# Patient Record
Sex: Female | Born: 1950 | Race: White | Hispanic: No | Marital: Married | Smoking: Never smoker
Health system: Southern US, Community
[De-identification: ages and names within clinical notes are randomized; demographics above are authoritative.]

## PROBLEM LIST (undated history)

## (undated) DIAGNOSIS — J45909 Unspecified asthma, uncomplicated: Secondary | ICD-10-CM

## (undated) DIAGNOSIS — T7840XA Allergy, unspecified, initial encounter: Secondary | ICD-10-CM

## (undated) HISTORY — PX: OTHER SURGICAL HISTORY: SHX169

## (undated) HISTORY — DX: Allergy, unspecified, initial encounter: T78.40XA

## (undated) HISTORY — DX: Unspecified asthma, uncomplicated: J45.909

---

## 2004-08-08 ENCOUNTER — Emergency Department (HOSPITAL_COMMUNITY): Admission: EM | Admit: 2004-08-08 | Discharge: 2004-08-08 | Payer: Self-pay | Admitting: *Deleted

## 2011-06-13 ENCOUNTER — Ambulatory Visit (INDEPENDENT_AMBULATORY_CARE_PROVIDER_SITE_OTHER): Payer: Self-pay | Admitting: Sports Medicine

## 2011-06-13 VITALS — BP 110/70 | Ht 67.0 in | Wt 150.0 lb

## 2011-06-13 DIAGNOSIS — S92309A Fracture of unspecified metatarsal bone(s), unspecified foot, initial encounter for closed fracture: Secondary | ICD-10-CM

## 2011-06-13 DIAGNOSIS — S92301A Fracture of unspecified metatarsal bone(s), right foot, initial encounter for closed fracture: Secondary | ICD-10-CM | POA: Insufficient documentation

## 2011-06-13 DIAGNOSIS — M79671 Pain in right foot: Secondary | ICD-10-CM

## 2011-06-13 DIAGNOSIS — M79609 Pain in unspecified limb: Secondary | ICD-10-CM

## 2011-06-13 NOTE — Assessment & Plan Note (Signed)
Erythema and swelling prompted treatment for cellulitis while pt was in [place].  However, these symptoms were likely due to the fracture.  Continue NSAIDS as needed for pain.

## 2011-06-13 NOTE — Assessment & Plan Note (Addendum)
Pt placed in arch sleeve to minimize vibration, in post-op shoe with scaphoid arch support.  Advised to bear weight as tolerated, OK to swim but should limit other activities that cause pain.  Pt advised to take Calcium and Vitamin D.  She has history of navicular stress fracture, concern about her bone density.  Likely needs bone scan at some point. Advised OK to continue over the counter NSAIDS for pain.  We need to evaluate normal gait pattern and whether leg length difference and scoliotic change balance each other Follow up in 2 weeks.

## 2011-06-13 NOTE — Progress Notes (Signed)
  Subjective:    Patient ID: Cathy Patterson, female    DOB: 1951-02-26, 60 y.o.   MRN: [id number]  HPI  Cathy Patterson presents for evaluation of right foot pain. She just returned from vacation in [place], where she was doing a lot of hiking. She says that about 10 days ago, she started having pain in the right foot. She says that it was red and swollen.  She went to a clinic in the area, where they treated her for cellulitis with azithromycin. She was sent to clinic in a bigger city, when her pain and swelling did not improve with the azithromycin. There she had an x-ray, and she was told she had fracture of her second metatarsal. She was placed in a cast and told not to bear weight.    Review of Systems Denies fevers chills, body aches.    Objective:   Physical Exam  BP 110/70  Ht 5\' 7"  (1.702 m)  Wt 150 lb (68.04 kg)  BMI 23.49 kg/m2 General appearance: alert, cooperative and no distress MSK: Right foot swollen on dorsum over metatarsals Pain with palpation of second right metatarsal, pain with movement of that toe. Pt is able to bear weight with a limp  US Exam: 2nd right metatarsal shaft fracture visualized, with about 30% displacement Hypoechoic areas around 2nd, 3rd and 4th metatarsals in soft tissue 3rd and 4th metatarsals in tact, no fractures.       Assessment & Plan:

## 2011-06-28 ENCOUNTER — Ambulatory Visit: Payer: Self-pay | Admitting: Sports Medicine

## 2011-07-05 ENCOUNTER — Ambulatory Visit (INDEPENDENT_AMBULATORY_CARE_PROVIDER_SITE_OTHER): Payer: Self-pay | Admitting: Family Medicine

## 2011-07-05 ENCOUNTER — Encounter: Payer: Self-pay | Admitting: Family Medicine

## 2011-07-05 DIAGNOSIS — M79609 Pain in unspecified limb: Secondary | ICD-10-CM

## 2011-07-05 DIAGNOSIS — M79671 Pain in right foot: Secondary | ICD-10-CM

## 2011-07-05 DIAGNOSIS — S92309A Fracture of unspecified metatarsal bone(s), unspecified foot, initial encounter for closed fracture: Secondary | ICD-10-CM

## 2011-07-05 DIAGNOSIS — S92301A Fracture of unspecified metatarsal bone(s), right foot, initial encounter for closed fracture: Secondary | ICD-10-CM

## 2011-07-05 NOTE — Patient Instructions (Signed)
Your 2nd metatarsal fracture is healing up great! Use the postop shoe with scaphoid pad for 2 more weeks then transition to a comfortable shoe with arch support for 2 more weeks. Try to avoid barefoot walking as much as possible. Icing 15 minutes at a time as needed for pain. Tylenol as needed for pain. Would recommend waiting 2 weeks before doing more intense walking and lower body exercises. Generally you start walking about 10 minutes when you do and do so every other day increasing each session by 5 minutes each time. You should not be limping and pain should be less than a 3 on a scale of 1-10 when you go back to these activities. Follow up with me or Dr. Darrick Penna in 4 weeks for a repeat ultrasound.

## 2011-07-06 ENCOUNTER — Encounter: Payer: Self-pay | Admitting: Family Medicine

## 2011-07-06 NOTE — Assessment & Plan Note (Signed)
Right 2nd metatarsal shaft fracture - approximately 3-4 weeks removed from initial treatment in [place] office for metatarsal shaft fracture.  She is improved quite a bit from last visit - use postop shoe for additional 2 weeks with arch strap then transition to comfortable shoe with arch support for additional 2 weeks.  Icing, tylenol prn.  Return to walking in 2 weeks as long as not limping and pain less than a 3 on 1-10 scale.  See instructions for further.  F/u in 4 weeks for repeat eval and ultrasound.  Will move forward with DEXA scan - patient turns 60 soon and has prior h/o stress fracture and now with fairly low impact metatarsal fracture - will rule out osteoporosis/osteopenia. 

## 2011-07-06 NOTE — Assessment & Plan Note (Signed)
Right 2nd metatarsal shaft fracture - approximately 3-4 weeks removed from initial treatment in [facility] office for metatarsal shaft fracture.  She is improved quite a bit from last visit - use postop shoe for additional 2 weeks with arch strap then transition to comfortable shoe with arch support for additional 2 weeks.  Icing, tylenol prn.  Return to walking in 2 weeks as long as not limping and pain less than a 3 on 1-10 scale.  See instructions for further.  F/u in 4 weeks for repeat eval and ultrasound.  Will move forward with DEXA scan - patient turns 60 soon and has prior h/o stress fracture and now with fairly low impact metatarsal fracture - will rule out osteoporosis/osteopenia.

## 2011-07-06 NOTE — Progress Notes (Signed)
Subjective:    Patient ID: Cathy Patterson, female    DOB: [birth year], 60 y.o.   MRN: [id number]  PCP: Dr Hal Hope  HPI 60 yo F here for 3 week f/u right 2nd MT fracture.  10/15: Cathy Patterson presents for evaluation of right foot pain. She just returned from vacation in [place], where she was doing a lot of hiking. She says that about 10 days ago, she started having pain in the right foot. She says that it was red and swollen.  She went to a clinic in the area, where they treated her for cellulitis with azithromycin. She was sent to clinic in a bigger city, when her pain and swelling did not improve with the azithromycin. There she had an x-ray, and she was told she had fracture of her second metatarsal. She was placed in a cast and told not to bear weight.  11/6: Patient has done well with postop shoe with scaphoid pad - compliant with wearing this as well as the arch strap. Takes calcium and vitamin D regularly.  Has not had a DEXA scan. No longer with pan at fracture site, only mild swelling.   History reviewed. No pertinent past medical history.  Current Outpatient Prescriptions on File Prior to Visit  Medication Sig Dispense Refill  . naproxen (NAPROSYN) 500 MG tablet Take 500 mg by mouth 2 (two) times daily with a meal.          History reviewed. No pertinent past surgical history.  Allergies  Allergen Reactions  . Sulfa Antibiotics   . Terak (Terramycin)     History   Social History  . Marital Status: Married    Spouse Name: N/A    Number of Children: N/A  . Years of Education: N/A   Occupational History  . Not on file.   Social History Main Topics  . Smoking status: Never Smoker   . Smokeless tobacco: Not on file  . Alcohol Use: Not on file  . Drug Use: Not on file  . Sexually Active: Not on file   Other Topics Concern  . Not on file   Social History Narrative  . No narrative on file    Family History  Problem Relation Age of Onset  . Hypertension Mother    . Heart attack Mother   . Sudden death Mother   . Hypertension Father   . Diabetes Neg Hx   . Hyperlipidemia Neg Hx     BP 131/86  Pulse 67  Temp(Src) 98.1 F (36.7 C) (Oral)  Ht 5\' 7"  (1.702 m)  Wt 150 lb (68.04 kg)  BMI 23.49 kg/m2  Review of Systems  See HPI above.   Objective:   Physical Exam  Gen: NAD  R foot: Minimal edema overlying 2nd MT dorsally.  No other deformity, bruising. Mild TTP distal 2nd MT.  No other TTP about foot or ankle including base 5th, navicular, malleoli, other MTs. FROM ankle and toes without pain. NVI distally Ambulates without limp Bilateral leg lengths 85cm from ASIS to medial malleoli.  US Exam: Excellent interval healing of right 2nd MT shaft fracture with bony callus, neovascularity, and localizd edema overlying fracture site.  Images saved for documentation.     Assessment & Plan:  1. Right 2nd metatarsal shaft fracture - approximately 3-4 weeks removed from initial treatment in [facility] office for metatarsal shaft fracture.  She is improved quite a bit from last visit - use postop shoe for additional 2 weeks with arch strap then  transition to comfortable shoe with arch support for additional 2 weeks.  Icing, tylenol prn.  Return to walking in 2 weeks as long as not limping and pain less than a 3 on 1-10 scale.  See instructions for further.  F/u in 4 weeks for repeat eval and ultrasound.  Will move forward with DEXA scan - patient turns 60 soon and has prior h/o stress fracture and now with fairly low impact metatarsal fracture - will rule out osteoporosis/osteopenia.

## 2011-07-08 ENCOUNTER — Ambulatory Visit: Payer: Self-pay | Admitting: Sports Medicine

## 2011-08-01 ENCOUNTER — Encounter: Payer: Self-pay | Admitting: Sports Medicine

## 2011-08-01 ENCOUNTER — Ambulatory Visit (INDEPENDENT_AMBULATORY_CARE_PROVIDER_SITE_OTHER): Payer: Self-pay | Admitting: Sports Medicine

## 2011-08-01 VITALS — BP 119/76 | HR 70

## 2011-08-01 DIAGNOSIS — M79671 Pain in right foot: Secondary | ICD-10-CM

## 2011-08-01 DIAGNOSIS — M79609 Pain in unspecified limb: Secondary | ICD-10-CM

## 2011-08-01 DIAGNOSIS — S92309A Fracture of unspecified metatarsal bone(s), unspecified foot, initial encounter for closed fracture: Secondary | ICD-10-CM

## 2011-08-01 DIAGNOSIS — S92301A Fracture of unspecified metatarsal bone(s), right foot, initial encounter for closed fracture: Secondary | ICD-10-CM

## 2011-08-01 NOTE — Assessment & Plan Note (Signed)
Much improved and no pain on examination in the office today

## 2011-08-01 NOTE — Assessment & Plan Note (Signed)
[facility] ultrasound today reveals hard callus There is excellent bony healing noted on both longitudinal and transverse scans There still is increased blood flow at the proximal and distal end of the callus of the distal second metatarsal on the right  I think this represents excellent healing and she is 90-95% over the effects of this fracture  These are our strep over the next month Restart her exercises and walking program on a gradual basis Avoid standing on the toes were pointing to much pressure on the forefoot of the right foot for at least one more month  Recheck if any problems. She can check with Dr. Hal Hope who is her primary care physician to decide whether a bone density measurements are needed. It is unclear in this situation as she was walking in bare foot shoes when the injury occurred.

## 2011-08-01 NOTE — Patient Instructions (Signed)
It is ok for you to return to activity  Please avoid activities that elevate your right foot  Please continue taking calcium and Vitamin D  Continue to use arch strap for the next month  Follow up as needed, if you continue doing well, you do not need to follow up.    Thank you for seeing [place] today!

## 2011-08-01 NOTE — Progress Notes (Signed)
  Subjective:    Patient ID: Cathy Patterson, female    DOB: 07-14-1951, 60 y.o.   MRN: [id number]  HPI Patient returns for followup of a metatarsal fracture of her right foot.  She was on it while walking in bare foot shoes and stepped on a rock while in [place]. X-rays did show a displaced second metatarsal fracture. We have been treating her with conservative care and she continues making good progress. She is now out of the cast boot is having very little pain with walking. 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

## 2013-02-19 ENCOUNTER — Ambulatory Visit: Payer: Self-pay | Admitting: Family Medicine

## 2013-03-27 ENCOUNTER — Encounter (HOSPITAL_COMMUNITY): Payer: Self-pay | Admitting: Emergency Medicine

## 2013-03-27 ENCOUNTER — Emergency Department (HOSPITAL_COMMUNITY): Payer: BC Managed Care – PPO

## 2013-03-27 ENCOUNTER — Emergency Department (HOSPITAL_COMMUNITY)
Admission: EM | Admit: 2013-03-27 | Discharge: 2013-03-28 | Disposition: A | Discharge status: Home / Self Care | Payer: BC Managed Care – PPO | Attending: Emergency Medicine | Admitting: Emergency Medicine

## 2013-03-27 ENCOUNTER — Ambulatory Visit (INDEPENDENT_AMBULATORY_CARE_PROVIDER_SITE_OTHER): Payer: BC Managed Care – PPO | Admitting: Emergency Medicine

## 2013-03-27 VITALS — BP 102/68 | HR 76 | Temp 97.7°F | Resp 16 | Ht 67.0 in | Wt 157.0 lb

## 2013-03-27 DIAGNOSIS — I200 Unstable angina: Secondary | ICD-10-CM

## 2013-03-27 DIAGNOSIS — R110 Nausea: Secondary | ICD-10-CM | POA: Insufficient documentation

## 2013-03-27 DIAGNOSIS — I249 Acute ischemic heart disease, unspecified: Secondary | ICD-10-CM

## 2013-03-27 DIAGNOSIS — R079 Chest pain, unspecified: Secondary | ICD-10-CM

## 2013-03-27 DIAGNOSIS — E860 Dehydration: Secondary | ICD-10-CM

## 2013-03-27 DIAGNOSIS — R0789 Other chest pain: Secondary | ICD-10-CM | POA: Insufficient documentation

## 2013-03-27 DIAGNOSIS — Z79899 Other long term (current) drug therapy: Secondary | ICD-10-CM | POA: Insufficient documentation

## 2013-03-27 DIAGNOSIS — J45909 Unspecified asthma, uncomplicated: Secondary | ICD-10-CM | POA: Insufficient documentation

## 2013-03-27 DIAGNOSIS — R0602 Shortness of breath: Secondary | ICD-10-CM | POA: Insufficient documentation

## 2013-03-27 LAB — CBC
HCT: 39.7 % (ref 36.0–46.0)
Hemoglobin: 13.8 g/dL (ref 12.0–15.0)
MCHC: 34.8 g/dL (ref 30.0–36.0)
Platelets: 241 10*3/uL (ref 150–400)
RBC: 4.27 MIL/uL (ref 3.87–5.11)
RDW: 13.3 % (ref 11.5–15.5)
WBC: 6.1 10*3/uL (ref 4.0–10.5)

## 2013-03-27 LAB — BASIC METABOLIC PANEL
BUN: 12 mg/dL (ref 6–23)
CO2: 26 mEq/L (ref 19–32)
Calcium: 9.3 mg/dL (ref 8.4–10.5)
Chloride: 106 mEq/L (ref 96–112)
Creatinine, Ser: 0.75 mg/dL (ref 0.50–1.10)
GFR calc Af Amer: >90 mL/min (ref >90)
GFR calc non Af Amer: 90 mL/min — ABNORMAL LOW (ref >90)
Potassium: 4 mEq/L (ref 3.5–5.1)

## 2013-03-27 LAB — POCT I-STAT TROPONIN I: Troponin i, poc: 0 ng/mL (ref 0.00–0.08)

## 2013-03-27 LAB — D-DIMER, QUANTITATIVE: D-Dimer, Quant: <0.27 ug/mL-FEU (ref 0.00–0.48)

## 2013-03-27 MED ORDER — ASPIRIN EC 325 MG PO TBEC: 325.0000 mg | DELAYED_RELEASE_TABLET | Freq: Every day | ORAL | Status: DC

## 2013-03-27 MED ORDER — NITROGLYCERIN 0.4 MG SL SUBL: 0.4000 mg | SUBLINGUAL_TABLET | SUBLINGUAL | Status: DC | PRN

## 2013-03-27 NOTE — Consult Note (Signed)
Cardiology IP Consult  Reason for Consult:chest pain Referring Physician: ED  HPI: Cathy Patterson is a 62 y.o.female with hx relevant for asthma, minimal CHD risk factors who presented to the ED for evaluation of chest pressure. Patient is a very active and healthy woman who exercises almost daily without apparent CV sx. Today, she had her hair colored for the first time ever. During this process, she had a response to the agent used to color her hair and began to feel ill. This progressed and she reported chest pressure that went through to her back. A/w SOB, nausea. Sx lasted for about 2 hours. She finally went to an Urgent Care center and was provided an ASA and NTG x 1. They referred her to our ED. On arrival here, her sx had completely resolved and she felt ready and able to return home. She was at her baseline. ED evaluation here was notable for a negative troponin, normal ECG. I am asked to evaluate her further while in the ED.   History reviewed. No pertinent past medical history.  History reviewed. No pertinent past surgical history.  Family History  Problem Relation Age of Onset  . Hypertension Mother   . Heart attack Mother   . Sudden death Mother   . Hypertension Father   . Diabetes Neg Hx   . Hyperlipidemia Neg Hx     Social History:  reports that she has never smoked. She has never used smokeless tobacco. Her alcohol and drug histories are not on file.  Allergies:  Allergies  Allergen Reactions  . Sulfa Antibiotics Other (See Comments)    unknown  . Terak (Terramycin) Other (See Comments)    unknown    No current facility-administered medications for this encounter.   Current Outpatient Prescriptions  Medication Sig Dispense Refill  . albuterol (PROVENTIL HFA;VENTOLIN HFA) 108 (90 BASE) MCG/ACT inhaler Inhale 2 puffs into the lungs every 6 (six) hours as needed for wheezing.      . Cholecalciferol (VITAMIN D PO) Take 2 tablets by mouth daily.      Marland Kitchen  tetrahydrozoline-zinc (VISINE-AC) 0.05-0.25 % ophthalmic solution Place 1 drop into both eyes 3 (three) times daily as needed.        ROS: A full review of systems is obtained and is negative except as noted in the HPI.  Physical Exam: Blood pressure 111/70, pulse 68, temperature 98.8 F (37.1 C), temperature source Oral, resp. rate 22, SpO2 98.00%.  GENERAL: Thin, healthy appearing, pleasant female. Appears younger than stated age. No acute distress.  EYES: Extra ocular movements are intact. There is no lid lag. Sclera is anicteric.  ENT: Oropharynx is clear. Dentition is within normal limits.  NECK: Supple. The thyroid is not enlarged.  LYMPH: There are no masses or lymphadenopathy present.  HEART: Regular rate and rhythm with no m/g/r.  Normal S1/S2. No JVD LUNGS: Clear to auscultation There are no rales, rhonchi, or wheezes.  ABDOMEN: Soft, non-tender, and non-distended with normoactive bowel sounds. There is no hepatosplenomegaly.  EXTREMITIES: No clubbing, cyanosis, or edema.  PULSES: Carotids were +2 and equal bilaterally with no bruits. Femoral pulses were +2 and equal bilaterally. DP/PT pulses were +2 and equal bilaterally.  SKIN: Warm, dry, and intact.  NEUROLOGIC: The patient was oriented to person, place, and time. No overt neurologic deficits were detected.  PSYCH: Normal judgment and insight, mood is appropriate.    Results: Results for orders placed during the hospital encounter of 03/27/13 (from the past 24 hour(s))  CBC     Status: None   Collection Time    03/27/13  9:08 PM      Result Value Range   WBC 6.1  4.0 - 10.5 K/uL   RBC 4.27  3.87 - 5.11 MIL/uL   Hemoglobin 13.8  12.0 - 15.0 g/dL   HCT 86.5  78.4 - 69.6 %   MCV 93.0  78.0 - 100.0 fL   MCH 32.3  26.0 - 34.0 pg   MCHC 34.8  30.0 - 36.0 g/dL   RDW 29.5  28.4 - 13.2 %   Platelets 241  150 - 400 K/uL  BASIC METABOLIC PANEL     Status: Abnormal   Collection Time    03/27/13  9:08 PM      Result Value  Range   Sodium 141  135 - 145 mEq/L   Potassium 4.0  3.5 - 5.1 mEq/L   Chloride 106  96 - 112 mEq/L   CO2 26  19 - 32 mEq/L   Glucose, Bld 136 (*) 70 - 99 mg/dL   BUN 12  6 - 23 mg/dL   Creatinine, Ser 4.40  0.50 - 1.10 mg/dL   Calcium 9.3  8.4 - 10.2 mg/dL   GFR calc non Af Amer 90 (*) >90 mL/min   GFR calc Af Amer >90  >90 mL/min  POCT I-STAT TROPONIN I     Status: None   Collection Time    03/27/13  9:22 PM      Result Value Range   Troponin i, poc 0.00  0.00 - 0.08 ng/mL   Comment 3           D-DIMER, QUANTITATIVE     Status: None   Collection Time    03/27/13  9:48 PM      Result Value Range   D-Dimer, Quant <0.27  0.00 - 0.48 ug/mL-FEU    CXR: No acute cardiopulmonary disease.  EKG: NSR, rate 63. Within normal limits.   Assessment/Plan: [name] is a typically very healthy and active woman who presents for evaluation of chest pressure following an exposure to a hair coloring agent. Her CV exam now is unremarkable, she has a normal ECG and negative biomarkers. The etiology of her episode is unclear to me, but I think it is unlikely that this represents active myocardial ischemia. I have recommended that she follow up with [facility] in about a week (she currently has no PCP). Further consideration for an ischemic evaluation would be considered at that time. In the interim, if she has recurrent sx, I have instructed her to return to the ED emergently.    Sly Parlee 03/27/2013, 11:43 PM

## 2013-03-27 NOTE — ED Notes (Signed)
Per EMS, Pt was getting hair colored today around 3pm and started to get lightheaded. Pt states she thought it was from the fumes, and dismissed it. A few minutes later, pt experienced pain that started in the center of her chest and radiated to her right arm and to her back. Pt described pain as a sharp 7/10 pain. Pt went to [facility] and was given 325 ASA and 1 nitro. Pt is now pain free. BP 128/88 HR 88 98% RA

## 2013-03-27 NOTE — Progress Notes (Signed)
  Subjective:    Cathy Patterson is a 62 y.o. female who presents for evaluation of chest pain. Onset was 3 hours ago. Symptoms have been stable since that time. The patient describes the pain as dull, heaviness, pressure and radiates to the right arm and neck. Patient rates pain as a moderate pain in intensity. Associated symptoms are: chest pressure/discomfort and fatigue. Aggravating factors are: none. Alleviating factors are: rest. Patient's cardiac risk factors are: advanced age (older than 50 for men, 3 for women). Patient's risk factors for DVT/PE: none. Previous cardiac testing: electrocardiogram (ECG).  The following portions of the patient's history were reviewed and updated as appropriate: allergies, current medications, past family history, past medical history, past social history, past surgical history and problem list.  Review of Systems A comprehensive review of systems was negative.    Objective:    BP 102/68  Pulse 76  Temp(Src) 97.7 F (36.5 C) (Oral)  Resp 16  Ht 5\' 7"  (1.702 m)  Wt 157 lb (71.215 kg)  BMI 24.58 kg/m2  SpO2 98%  General Appearance:    Alert, cooperative, no distress, appears stated age  Head:    Normocephalic, without obvious abnormality, atraumatic  Eyes:    PERRL, conjunctiva/corneas clear, EOM's intact, fundi    benign, both eyes  Ears:    Normal TM's and external ear canals, both ears  Nose:   Nares normal, septum midline, mucosa normal, no drainage    or sinus tenderness  Throat:   Lips, mucosa, and tongue normal; teeth and gums normal  Neck:   Supple, symmetrical, trachea midline, no adenopathy;    thyroid:  no enlargement/tenderness/nodules; no carotid   bruit or JVD  Back:     Symmetric, no curvature, ROM normal, no CVA tenderness  Lungs:     Clear to auscultation bilaterally, respirations unlabored  Chest Wall:    No tenderness or deformity   Heart:    Regular rate and rhythm, S1 and S2 normal, no murmur, rub   or gallop     Abdomen:      Soft, non-tender, bowel sounds active all four quadrants,    no masses, no organomegaly        Extremities:   Extremities normal, atraumatic, no cyanosis or edema  Pulses:   2+ and symmetric all extremities  Skin:   Skin color, texture, turgor normal, no rashes or lesions  Lymph nodes:   Cervical, supraclavicular, and axillary nodes normal  Neurologic:   CNII-XII intact, normal strength, sensation and reflexes    throughout    Cardiographics ECG: normal sinus rhythm, no blocks or conduction defects, no ischemic changes  Imaging Chest x-ray: not indicated    Assessment:    Chest pain, suspected etiology: acute coronary syndrome    Plan:    To emergency department via ambulance. Serial cardiac markers and ECG.   Pain improved in office with NTG x 2 and ASA

## 2013-03-27 NOTE — ED Provider Notes (Signed)
CSN: [id number]     Arrival date & time 03/27/13  1952 History     First MD Initiated Contact with Patient 03/27/13 2043     Chief Complaint  Patient presents with  . Chest Pain   (Consider location/radiation/quality/duration/timing/severity/associated sxs/prior Treatment) HPI Pt seen at [facility] urgent care and sent to ED for CAD evaluation. Pt has no CAD hx. No smoking history. Mother with MI in early 41's. Pt was at salon having hair colored. Became acutely SOB with chest tightness. Pain radiated into neck and R shoulder. This was roughly 1500 today. Symptoms had improved when seen in outpt office. Given ASA and NTG with complete resolution of symptoms. No recent lengthy travel or surgery. No lower ext swelling or pain.  History reviewed. No pertinent past medical history. History reviewed. No pertinent past surgical history. Family History  Problem Relation Age of Onset  . Hypertension Mother   . Heart attack Mother   . Sudden death Mother   . Hypertension Father   . Diabetes Neg Hx   . Hyperlipidemia Neg Hx    History  Substance Use Topics  . Smoking status: Never Smoker   . Smokeless tobacco: Never Used  . Alcohol Use: Not on file   OB History   Grav Para Term Preterm Abortions TAB SAB Ect Mult Living                 Review of Systems  Constitutional: Negative for fever and chills.  HENT: Negative for neck pain.   Eyes: Negative for visual disturbance.  Respiratory: Positive for shortness of breath. Negative for cough and wheezing.   Cardiovascular: Positive for chest pain. Negative for palpitations and leg swelling.  Gastrointestinal: Negative for nausea, vomiting and abdominal pain.  Musculoskeletal: Negative for back pain.  Skin: Negative for rash and wound.  Neurological: Negative for dizziness, weakness, light-headedness, numbness and headaches.  All other systems reviewed and are negative.    Allergies  Sulfa antibiotics and Terak  Home Medications    Current Outpatient Rx  Name  Route  Sig  Dispense  Refill  . albuterol (PROVENTIL HFA;VENTOLIN HFA) 108 (90 BASE) MCG/ACT inhaler   Inhalation   Inhale 2 puffs into the lungs every 6 (six) hours as needed for wheezing.         . Cholecalciferol (VITAMIN D PO)   Oral   Take 2 tablets by mouth daily.         Marland Kitchen tetrahydrozoline-zinc (VISINE-AC) 0.05-0.25 % ophthalmic solution   Both Eyes   Place 1 drop into both eyes 3 (three) times daily as needed.          BP 111/70  Pulse 68  Temp(Src) 98.8 F (37.1 C) (Oral)  Resp 22  SpO2 98% Physical Exam  Nursing note and vitals reviewed. Constitutional: She is oriented to person, place, and time. She appears well-developed and well-nourished. No distress.  HENT:  Head: Normocephalic and atraumatic.  Mouth/Throat: Oropharynx is clear and moist.  Eyes: EOM are normal. Pupils are equal, round, and reactive to light.  Neck: Normal range of motion. Neck supple.  Cardiovascular: Normal rate and regular rhythm.  Exam reveals friction rub. Exam reveals no gallop.   No murmur heard. Pulmonary/Chest: Effort normal and breath sounds normal. No respiratory distress. She has no wheezes. She has no rales. She exhibits no tenderness.  Abdominal: Soft. Bowel sounds are normal. She exhibits no distension and no mass. There is no tenderness. There is no rebound and no  guarding.  Musculoskeletal: Normal range of motion. She exhibits no edema and no tenderness.  No calf swelling or tenderness  Neurological: She is alert and oriented to person, place, and time.  Skin: Skin is warm and dry. No rash noted. No erythema.  Psychiatric: She has a normal mood and affect. Her behavior is normal.    ED Course   Procedures (including critical care time)  Labs Reviewed  BASIC METABOLIC PANEL - Abnormal; Notable for the following:    Glucose, Bld 136 (*)    GFR calc non Af Amer 90 (*)    All other components within normal limits  CBC  D-DIMER,  QUANTITATIVE  POCT I-STAT TROPONIN I   Dg Chest Port 1 View  03/27/2013   *RADIOLOGY REPORT*  Clinical Data: Chest pain.  Dizziness.  Short of breath.  PORTABLE CHEST - 1 VIEW  Comparison: None.  Findings:  Cardiopericardial silhouette within normal limits. Mediastinal contours normal. Trachea midline.  No airspace disease or effusion. Monitoring leads are projected over the chest. No pneumothorax.  IMPRESSION: No active cardiopulmonary disease.   Original Report Authenticated By: Andreas Newport, M.D.   1. Atypical chest pain     Date: 03/27/2013  Rate: 68  Rhythm: normal sinus rhythm  QRS Axis: normal  Intervals: normal  ST/T Wave abnormalities: normal  Conduction Disutrbances:none  Narrative Interpretation:   Old EKG Reviewed: none available   MDM  Seen by cardiology. Doubt CAD. Will set up outpt follow up. Return precautions given.   Loren Racer, MD 03/27/13 [phone]

## 2013-04-25 ENCOUNTER — Encounter: Payer: Self-pay | Admitting: Family Medicine

## 2013-04-25 ENCOUNTER — Ambulatory Visit (INDEPENDENT_AMBULATORY_CARE_PROVIDER_SITE_OTHER): Payer: BC Managed Care – PPO | Admitting: Family Medicine

## 2013-04-25 VITALS — BP 110/72 | HR 82 | Temp 98.5°F | Resp 16 | Ht 67.0 in | Wt 158.2 lb

## 2013-04-25 DIAGNOSIS — J4520 Mild intermittent asthma, uncomplicated: Secondary | ICD-10-CM | POA: Insufficient documentation

## 2013-04-25 DIAGNOSIS — Z23 Encounter for immunization: Secondary | ICD-10-CM

## 2013-04-25 DIAGNOSIS — J45909 Unspecified asthma, uncomplicated: Secondary | ICD-10-CM

## 2013-04-25 DIAGNOSIS — Z760 Encounter for issue of repeat prescription: Secondary | ICD-10-CM

## 2013-04-25 MED ORDER — ALPRAZOLAM 0.25 MG PO TABS: ORAL_TABLET | ORAL | Status: DC

## 2013-04-25 MED ORDER — ALBUTEROL SULFATE HFA 108 (90 BASE) MCG/ACT IN AERS: 2.0000 | INHALATION_SPRAY | Freq: Four times a day (QID) | RESPIRATORY_TRACT | Status: AC | PRN

## 2013-04-25 MED ORDER — VALACYCLOVIR HCL 1 G PO TABS: ORAL_TABLET | ORAL | Status: DC

## 2013-04-25 NOTE — Progress Notes (Signed)
S:  This 62 y.o. 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 She has been in good health and has not had recurrent chest pain (eval in July at ED- noncardiac atypical CP). She has mild EIA and uses Albuterol MDI prn, not even daily. She reports some allergies to dust and dog hair as well as mild weather changes. She participates in long-distance hiking in [place] and is planning a trip in the spring. 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 Last CPE was ~ 1 year ago.  PMHx, Soc Hx and Fam Hx reviewed. Medications reconciled.  ROS: As per HPI; otherwise noncontributory.  O: Filed Vitals:   04/25/13 1552  BP: 110/72  Pulse: 82  Temp: 98.5 F (36.9 C)  Resp: 16   GEN: In NAD: WN,WD. HENT: [place]/AT; EOMI w/ clear conj/sclerae. Otherwise unremarkable. COR: RRR. LUNGS; Normal resp rate and effort. SKIN: W&D; intact w/o rashes or pallor. NEURO: A&O x 3; CNs intact. Nonfocal.  A/P: Issue of repeat prescriptions  Need for prophylactic vaccination with combined diphtheria-tetanus-pertussis (DTP) vaccine - Plan: Tdap vaccine greater than or equal to 7yo IM  Mild intermittent reactive airway disease without complication    Meds ordered this encounter  Medications  . albuterol (PROVENTIL HFA;VENTOLIN HFA) 108 (90 BASE) MCG/ACT inhaler    Sig: Inhale 2 puffs into the lungs every 6 (six) hours as needed for wheezing.    Dispense:  1 Inhaler    Refill:  5  . ALPRAZolam (XANAX) 0.25 MG tablet    Sig: Take 1/2 -1 tablet by mouth prn anxiety.    Dispense:  30 tablet    Refill:  0  . valACYclovir (VALTREX) 1000 MG tablet    Sig: Take 1 tablet twice a day or as directed.    Dispense:  30 tablet    Refill:  3   RTC in Spring 2015 for CPE.

## 2013-07-04 ENCOUNTER — Other Ambulatory Visit: Payer: Self-pay

## 2013-09-20 ENCOUNTER — Telehealth: Payer: Self-pay | Admitting: Radiology

## 2013-09-20 MED ORDER — ALPRAZOLAM 0.25 MG PO TABS: ORAL_TABLET | ORAL | Status: DC

## 2013-09-20 MED ORDER — VALACYCLOVIR HCL 1 G PO TABS: ORAL_TABLET | ORAL | Status: DC

## 2013-09-20 NOTE — Telephone Encounter (Signed)
Alprazolam refill phoned to pharmacy and Valtrex routed electronically.

## 2013-09-20 NOTE — Telephone Encounter (Signed)
Would like to know if she can have RF on Xanax and V[facility]rex? [facility] CB # [phone]

## 2013-09-23 NOTE — Telephone Encounter (Signed)
lmom that both rx's phoned into pharmacy.

## 2013-09-24 ENCOUNTER — Ambulatory Visit (INDEPENDENT_AMBULATORY_CARE_PROVIDER_SITE_OTHER): Payer: BC Managed Care – PPO | Admitting: Family Medicine

## 2013-09-24 ENCOUNTER — Encounter: Payer: Self-pay | Admitting: Family Medicine

## 2013-09-24 VITALS — BP 138/89 | HR 73 | Ht 68.0 in | Wt 160.0 lb

## 2013-09-24 DIAGNOSIS — M549 Dorsalgia, unspecified: Secondary | ICD-10-CM

## 2013-09-29 ENCOUNTER — Encounter: Payer: Self-pay | Admitting: Family Medicine

## 2013-09-29 DIAGNOSIS — M549 Dorsalgia, unspecified: Secondary | ICD-10-CM | POA: Insufficient documentation

## 2013-09-29 NOTE — Assessment & Plan Note (Signed)
her description and history do not classically fit with musculoskeletal pain.  Possible she's having biliary colic, less likely pancreaitis.  Has improved though over past week.  Advised if pain worsens to come back for evaluation or see her PCP for exam, possible further studies.  Otherwise f/u with us prn.

## 2013-09-29 NOTE — Progress Notes (Signed)
Patient ID: Danae ChenDeborah Kittel, female   DOB: 1950-09-07, 63 y.o.   MRN: [id number]  PCP: Oval Linseyitter, Karen Denise  Subjective:   HPI: Patient is a 63 y.o. female here for back pain.  Patient denies known trauma. States about a week ago she developoed fairly intense right sided thoracic back pain radiating around rib cage on right side. No nausea, vomiting, sweating, association with eating. No melena, hematochezia. Has improved quite a bit but not completely. No rash. Has had back pain in the past but usually lower middle of back. No numbness/tingling.  History reviewed. No pertinent past medical history.  Current Outpatient Prescriptions on File Prior to Visit  Medication Sig Dispense Refill  . albuterol (PROVENTIL HFA;VENTOLIN HFA) 108 (90 BASE) MCG/ACT inhaler Inhale 2 puffs into the lungs every 6 (six) hours as needed for wheezing.  1 Inhaler  5  . ALPRAZolam (XANAX) 0.25 MG tablet Take 1/2 -1 tablet by mouth prn anxiety.  30 tablet  0  . Cholecalciferol (VITAMIN D PO) Take 2 tablets by mouth daily.      Marland Kitchen. tetrahydrozoline-zinc (VISINE-AC) 0.05-0.25 % ophthalmic solution Place 1 drop into both eyes 3 (three) times daily as needed.      . valACYclovir (VALTREX) 1000 MG tablet Take 1 tablet twice a day or as directed.  30 tablet  3   No current facility-administered medications on file prior to visit.    History reviewed. No pertinent past surgical history.  Allergies  Allergen Reactions  . Sulfa Antibiotics Other (See Comments)    unknown  . Terak [Terramycin] Other (See Comments)    unknown    History   Social History  . Marital Status: Married    Spouse Name: N/A    Number of Children: N/A  . Years of Education: N/A   Occupational History  . Not on file.   Social History Main Topics  . Smoking status: Never Smoker   . Smokeless tobacco: Never Used  . Alcohol Use: Not on file  . Drug Use: Not on file  . Sexual Activity: Not on file   Other Topics Concern  . Not  on file   Social History Narrative  . No narrative on file    Family History  Problem Relation Age of Onset  . Hypertension Mother   . Heart attack Mother   . Sudden death Mother   . Hypertension Father   . Diabetes Neg Hx   . Hyperlipidemia Neg Hx     BP 138/89  Pulse 73  Ht 5\' 8"  (1.727 m)  Wt 160 lb (72.576 kg)  BMI 24.33 kg/m2  Review of Systems: See HPI above.    Objective:  Physical Exam:  Gen: NAD  Abd: soft, nt, nd.  No HSM.  Back: No gross deformity, scoliosis. No TTP .  No midline or bony TTP. FROM. Strength LEs 5/5 all muscle groups.  2+ MSRs in patellar and achilles tendons, equal bilaterally. Negative SLRs. Sensation intact to light touch bilaterally. Negative logroll bilateral hips.    Assessment & Plan:  1. Back/abdominal pain - her description and history do not classically fit with musculoskeletal pain.  Possible she's having biliary colic, less likely pancreaitis.  Has improved though over past week.  Advised if pain worsens to come back for evaluation or see her PCP for exam, possible further studies.  Otherwise f/u with us prn.

## 2014-01-23 ENCOUNTER — Ambulatory Visit: Payer: Self-pay | Admitting: Family Medicine

## 2014-01-23 VITALS — BP 124/74 | HR 76 | Temp 98.7°F | Resp 16 | Ht 67.0 in | Wt 154.4 lb

## 2014-01-23 DIAGNOSIS — R300 Dysuria: Secondary | ICD-10-CM

## 2014-01-23 DIAGNOSIS — N390 Urinary tract infection, site not specified: Secondary | ICD-10-CM

## 2014-01-23 LAB — POCT UA - MICROSCOPIC ONLY
Casts, Ur, LPF, POC: NEGATIVE
Crystals, Ur, HPF, POC: NEGATIVE
Mucus, UA: NEGATIVE
Renal tubular cells: POSITIVE
Yeast, UA: NEGATIVE

## 2014-01-23 LAB — POCT URINALYSIS DIPSTICK
Bilirubin, UA: NEGATIVE
Glucose, UA: NEGATIVE
Ketones, UA: NEGATIVE
Nitrite, UA: NEGATIVE
Protein, UA: >=300
Spec Grav, UA: 1.02
Urobilinogen, UA: 0.2
pH, UA: 5.5

## 2014-01-23 MED ORDER — CIPROFLOXACIN HCL 500 MG PO TABS: 500.0000 mg | ORAL_TABLET | Freq: Two times a day (BID) | ORAL | Status: DC

## 2014-01-23 NOTE — Patient Instructions (Signed)
Use the cipro twice a day for a UTI.  I will be in touch with your urine culture results when they come in.  You can probably stop the cipro after 7 days. Let me know if you do not feel better or if you start to feel worse!

## 2014-01-23 NOTE — Progress Notes (Signed)
[facility] [address] [place] [address] [phone]  Date:  01/23/2014   Name:  Cathy Patterson   DOB:  1951/07/27   MRN:  [id number]  PCP:  Oval Linsey    Chief Complaint: Dysuria   History of Present Illness:  Cathy Patterson is a 63 y.o. very pleasant female patient who presents with the following:  She is here today with a likley UTI.  She has noted sx for about 3 days- got worse today.   She has noted urinary frequency, dysuria, burning.  She has not necessarily noted blood in her urine, but did see a dark spot on her panty liner.   No fever, no nausea or vomiting. She notes pain in her left flank just when she urinates.  She is eating ok.    She is generally healthy  She has had a UTI in the past and this seems like the same to her No chronic meds  Patient Active Problem List   Diagnosis Date Noted  . Back pain 09/29/2013  . Mild intermittent reactive airway disease without complication 04/25/2013  . Right foot pain 06/13/2011  . Fracture of metatarsal bone of right foot 06/13/2011    No past medical history on file.  No past surgical history on file.  History  Substance Use Topics  . Smoking status: Never Smoker   . Smokeless tobacco: Never Used  . Alcohol Use: Not on file    Family History  Problem Relation Age of Onset  . Hypertension Mother   . Heart attack Mother   . Sudden death Mother   . Hypertension Father   . Diabetes Neg Hx   . Hyperlipidemia Neg Hx     Allergies  Allergen Reactions  . Sulfa Antibiotics Other (See Comments)    unknown  . Terak [Terramycin] Other (See Comments)    unknown    Medication list has been reviewed and updated.  Current Outpatient Prescriptions on File Prior to Visit  Medication Sig Dispense Refill  . albuterol (PROVENTIL HFA;VENTOLIN HFA) 108 (90 BASE) MCG/ACT inhaler Inhale 2 puffs into the lungs every 6 (six) hours as needed for wheezing.  1 Inhaler  5  . ALPRAZolam  (XANAX) 0.25 MG tablet Take 1/2 -1 tablet by mouth prn anxiety.  30 tablet  0  . Cholecalciferol (VITAMIN D PO) Take 2 tablets by mouth daily.      Marland Kitchen tetrahydrozoline-zinc (VISINE-AC) 0.05-0.25 % ophthalmic solution Place 1 drop into both eyes 3 (three) times daily as needed.      . valACYclovir (VALTREX) 1000 MG tablet Take 1 tablet twice a day or as directed.  30 tablet  3   No current facility-administered medications on file prior to visit.    Review of Systems:  As per HPI- otherwise negative.   Physical Examination: Filed Vitals:   01/23/14 1115  BP: 124/74  Pulse: 76  Temp: 98.7 F (37.1 C)  Resp: 16   Filed Vitals:   01/23/14 1115  Height: 5\' 7"  (1.702 m)  Weight: 154 lb 6.4 oz (70.035 kg)   Body mass index is 24.18 kg/(m^2). Ideal Body Weight: Weight in (lb) to have BMI = 25: 159.3  GEN: WDWN, NAD, Non-toxic, A & O x 3, slim build, looks well HEENT: Atraumatic, Normocephalic. Neck supple. No masses, No LAD. Ears and Nose: No external deformity. CV: RRR, No M/G/R. No JVD. No thrill. No extra heart sounds. PULM: CTA B, no wheezes, crackles, rhonchi.  No retractions. No resp. distress. No accessory muscle use. ABD: S, NT, ND, +BS. No rebound. No HSM.  No CVA tenderness. Exam is benign  EXTR: No c/c/e NEURO Normal gait.  PSYCH: Normally interactive. Conversant. Not depressed or anxious appearing.  Calm demeanor.   Results for orders placed in visit on 01/23/14  POCT URINALYSIS DIPSTICK      Result Value Ref Range   Color, UA yellow     Clarity, UA cloudy     Glucose, UA neg     Bilirubin, UA neg     Ketones, UA neg     Spec Grav, UA 1.020     Blood, UA large     pH, UA 5.5     Protein, UA >=300     Urobilinogen, UA 0.2     Nitrite, UA neg     Leukocytes, UA large (3+)    POCT UA - MICROSCOPIC ONLY      Result Value Ref Range   WBC, Ur, HPF, POC TNTC     RBC, urine, microscopic TNTC     Bacteria, U Microscopic 1+     Mucus, UA neg     Epithelial cells,  urine per micros 0-1     Crystals, Ur, HPF, POC neg     Casts, Ur, LPF, POC neg     Yeast, UA neg     Renal tubular cells positive      Assessment and Plan: Dysuria - Plan: POCT urinalysis dipstick, POCT UA - Microscopic Only, ciprofloxacin (CIPRO) 500 MG tablet, Urine culture  UTI (urinary tract infection)  Treat for UTI with cipro- chose this agent as she has noted some flank pain and may have early pyelo.    Signed Abbe AmsterdamJessica Jourden Gilson, MD

## 2014-01-25 LAB — URINE CULTURE: Colony Count: >=100000

## 2014-05-02 ENCOUNTER — Telehealth: Payer: Self-pay

## 2014-05-02 MED ORDER — ALPRAZOLAM 0.25 MG PO TABS: ORAL_TABLET | ORAL | Status: DC

## 2014-05-02 NOTE — Telephone Encounter (Signed)
Alprazolam refill phoned to pt's pharmacy. 

## 2014-05-02 NOTE — Telephone Encounter (Signed)
Pt needs her Cathy Patterson refilled before going out of town for a month.   Pt scheduled CPE for 07/09/2014 with Dr. Audria Nine.  PT# [phone]

## 2014-05-07 ENCOUNTER — Ambulatory Visit (INDEPENDENT_AMBULATORY_CARE_PROVIDER_SITE_OTHER): Payer: BC Managed Care – PPO | Admitting: Family Medicine

## 2014-05-07 ENCOUNTER — Encounter: Payer: Self-pay | Admitting: Family Medicine

## 2014-05-07 VITALS — BP 114/68 | HR 69 | Temp 98.2°F | Resp 17 | Ht 67.0 in | Wt 162.0 lb

## 2014-05-07 DIAGNOSIS — Z1231 Encounter for screening mammogram for malignant neoplasm of breast: Secondary | ICD-10-CM

## 2014-05-07 DIAGNOSIS — F411 Generalized anxiety disorder: Secondary | ICD-10-CM

## 2014-05-07 MED ORDER — ALPRAZOLAM 0.25 MG PO TABS: ORAL_TABLET | ORAL | Status: DC

## 2014-05-07 NOTE — Progress Notes (Signed)
S:  This 63 y.o. Cauc female has chronic anxiety disorder and is here for medication refill. She is scheduled for CPE/PAP w/ labs in Nov 2015. She is leaving next week to drive to [place] to stay w/ her daughter who is expecting her 2nd child in October. Pt feels well but has gained weight due to lack of regular exercise; she enjoys long distance hiking but has not been able to do that lately. Pt has no had MMG in > 5 years. She recalls having breast ultrasound due to dense breast tissue.  Patient Active Problem List   Diagnosis Date Noted  . Back pain 09/29/2013  . Mild intermittent reactive airway disease without complication 04/25/2013  . Right foot pain 06/13/2011  . Fracture of metatarsal bone of right foot 06/13/2011    Prior to Admission medications   Medication Sig Start Date End Date Taking? Authorizing Provider  albuterol (PROVENTIL HFA;VENTOLIN HFA) 108 (90 BASE) MCG/ACT inhaler Inhale 2 puffs into the lungs every 6 (six) hours as needed for wheezing. 04/25/13  Yes Maurice March, MD  ALPRAZolam Prudy Feeler) 0.25 MG tablet Take 1/2 -1 tablet by mouth prn anxiety.   Yes Maurice March, MD  Cholecalciferol (VITAMIN D PO) Take 2 tablets by mouth daily.   Yes Historical Provider, MD  tetrahydrozoline-zinc (VISINE-AC) 0.05-0.25 % ophthalmic solution Place 1 drop into both eyes 3 (three) times daily as needed.   Yes Historical Provider, MD  valACYclovir (VALTREX) 1000 MG tablet Take 1 tablet twice a day or as directed. 09/20/13  Yes Maurice March, MD   History   Social History  . Marital Status: Married    Spouse Name: N/A    Number of Children: N/A  . Years of Education: N/A   Occupational History  . Not on file.   Social History Main Topics  . Smoking status: Never Smoker   . Smokeless tobacco: Never Used  . Alcohol Use: Not on file  . Drug Use: Not on file  . Sexual Activity: Not on file    ROS: Negative for abnormal weight change, fatigue, CP or tightness,  palpitations, SOB or DOE, GI problems, myalgias, rashes, HA, dizziness, syncope, agitation, confusion, concentration difficulties or sleep disturbance.  O: Filed Vitals:   05/07/14 0843  BP: 114/68  Pulse: 69  Temp: 98.2 F (36.8 C)  Resp: 17    GEN: In NAD; WN, WD. HENT: [place]/AT; EOMI w/ clear conj/sclerae. Otherwise unremarkable. COR: RRR. LUNGS: Unlabored resp. SKIN: W&D; intact w/o erythema, diaphoresis or pallor. MS: MAEs; no deformities, atrophy or edema. NEURO: A&O x 3; CNs intact. Nonfocal.  A/P: Anxiety state, unspecified- RF Alprazolam; encouraged healthy lifestyle.  Other screening mammogram - Plan: MM Digital Screening (pt advised about 3D tomography which is beneficial for women who have dense breast).  Meds ordered this encounter  Medications  . ALPRAZolam (XANAX) 0.25 MG tablet    Sig: Take 1/2 -1 tablet by mouth prn anxiety.    Dispense:  30 tablet    Refill:  1

## 2014-05-07 NOTE — Patient Instructions (Signed)

## 2014-06-13 ENCOUNTER — Other Ambulatory Visit: Payer: Self-pay

## 2014-07-09 ENCOUNTER — Encounter: Payer: Self-pay | Admitting: Family Medicine

## 2014-07-09 ENCOUNTER — Ambulatory Visit (INDEPENDENT_AMBULATORY_CARE_PROVIDER_SITE_OTHER): Payer: BC Managed Care – PPO | Admitting: Family Medicine

## 2014-07-09 VITALS — BP 106/73 | HR 65 | Temp 97.6°F | Resp 16 | Ht 67.0 in | Wt 162.0 lb

## 2014-07-09 DIAGNOSIS — Z0000 Encounter for general adult medical examination without abnormal findings: Secondary | ICD-10-CM

## 2014-07-09 DIAGNOSIS — Z1159 Encounter for screening for other viral diseases: Secondary | ICD-10-CM

## 2014-07-09 DIAGNOSIS — Z124 Encounter for screening for malignant neoplasm of cervix: Secondary | ICD-10-CM

## 2014-07-09 DIAGNOSIS — Z1211 Encounter for screening for malignant neoplasm of colon: Secondary | ICD-10-CM

## 2014-07-09 DIAGNOSIS — Z01419 Encounter for gynecological examination (general) (routine) without abnormal findings: Secondary | ICD-10-CM

## 2014-07-09 LAB — POCT UA - MICROSCOPIC ONLY
CASTS, UR, LPF, POC: NEGATIVE
Epithelial cells, urine per micros: NEGATIVE
Mucus, UA: NEGATIVE
RBC, URINE, MICROSCOPIC: NEGATIVE
WBC, Ur, HPF, POC: NEGATIVE
YEAST UA: NEGATIVE

## 2014-07-09 LAB — COMPLETE METABOLIC PANEL WITH GFR
ALT: 24 U/L (ref 0–35)
AST: 21 U/L (ref 0–37)
Albumin: 3.9 g/dL (ref 3.5–5.2)
Alkaline Phosphatase: 67 U/L (ref 39–117)
BUN: 14 mg/dL (ref 6–23)
CO2: 24 mEq/L (ref 19–32)
CREATININE: 0.74 mg/dL (ref 0.50–1.10)
Calcium: 9.2 mg/dL (ref 8.4–10.5)
Chloride: 104 mEq/L (ref 96–112)
GFR, Est African American: >89 mL/min
GFR, Est Non African American: 87 mL/min
Glucose, Bld: 79 mg/dL (ref 70–99)
Potassium: 4.5 mEq/L (ref 3.5–5.3)
Sodium: 138 mEq/L (ref 135–145)
Total Bilirubin: 0.6 mg/dL (ref 0.2–1.2)
Total Protein: 6 g/dL (ref 6.0–8.3)

## 2014-07-09 LAB — POCT URINALYSIS DIPSTICK
Bilirubin, UA: NEGATIVE
Blood, UA: NEGATIVE
Glucose, UA: NEGATIVE
KETONES UA: NEGATIVE
LEUKOCYTES UA: NEGATIVE
NITRITE UA: NEGATIVE
PROTEIN UA: NEGATIVE
Spec Grav, UA: <=1.005
UROBILINOGEN UA: 0.2
pH, UA: 5

## 2014-07-09 LAB — TSH: TSH: 1.781 u[IU]/mL (ref 0.350–4.500)

## 2014-07-09 LAB — LIPID PANEL
Cholesterol: 223 mg/dL — ABNORMAL HIGH (ref 0–200)
HDL: 109 mg/dL (ref >39)
LDL Cholesterol: 99 mg/dL (ref 0–99)
TRIGLYCERIDES: 76 mg/dL (ref <150)
Total CHOL/HDL Ratio: 2 Ratio
VLDL: 15 mg/dL (ref 0–40)

## 2014-07-09 NOTE — Progress Notes (Signed)
Subjective:    Patient ID: Cathy ChenDeborah Whidbee, female    DOB: 02/11/1951, 63 y.o.   MRN: [id number]  HPI  This 63 y.o. Cauc female is here for CPE/PAP. 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 Pt plans to schedule appt for skin surveillance. Pt has intermittent airway disease w/ bronchospasms associated w/ specific triggers; she has albuterol MDI for prn use. 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   HCM: MMG- Pt to sch @ [facility].           PAP- > 5 years ago (last one negative).           CRS- Never; "insurance deductible to high".            IMM- Pt declines Flu and other vaccines.  Patient Active Problem List   Diagnosis Date Noted  . Back pain 09/29/2013  . Mild intermittent reactive airway disease without complication 04/25/2013  . Right foot pain 06/13/2011  . Fracture of metatarsal bone of right foot 06/13/2011    Prior to Admission medications   Medication Sig Start Date End Date Taking? Authorizing Provider  albuterol (PROVENTIL HFA;VENTOLIN HFA) 108 (90 BASE) MCG/ACT inhaler Inhale 2 puffs into the lungs every 6 (six) hours as needed for wheezing. 04/25/13  Yes Maurice MarchBarbara B Camisha Srey, MD  ALPRAZolam Prudy Feeler(XANAX) 0.25 MG tablet Take 1/2 -1 tablet by mouth prn anxiety. 05/07/14  Yes Maurice MarchBarbara B Cynde Menard, MD  Cholecalciferol (VITAMIN D PO) Take 2 tablets by mouth daily.   Yes Historical Provider, MD  tetrahydrozoline-zinc (VISINE-AC) 0.05-0.25 % ophthalmic solution Place 1 drop into both eyes 3 (three) times daily as needed.   Yes Historical Provider, MD  valACYclovir (VALTREX) 1000 MG tablet Take 1 tablet twice a day or as directed. 09/20/13  Yes Maurice MarchBarbara B Elveria Lauderbaugh, MD    History   Social History  . Marital Status: Married    Spouse Name: N/A    Number of Children: N/A  . Years of Education: N/A   Occupational History  . Unemployed     Social History Main Topics  . Smoking status: Never Smoker   . Smokeless tobacco: Never Used  . Alcohol Use: 0.6 oz/week    1 Not specified per week  . Drug Use: No  . Sexual Activity: Yes   Other Topics Concern  . Not on file   Social History Narrative   Married. Education: [employer]. Exercise: Yes    Family History  Problem Relation Age of Onset  . Hypertension Mother   . Heart attack Mother   . Sudden death Mother   . Heart disease Mother   . Hyperlipidemia Mother   . Hypertension Father   . Cancer Father   . Hyperlipidemia Father   . Diabetes Neg Hx   . Hypertension Sister   . Cancer Brother     Review of Systems  Constitutional: Negative.   HENT: Negative.   Eyes: Negative.   Respiratory: Negative.   Cardiovascular: Negative.   Gastrointestinal: Negative.   Endocrine: Negative.   Genitourinary: Negative.   Musculoskeletal: Negative.   Skin: Negative.   Allergic/Immunologic: Positive for environmental allergies.  Neurological: Negative.   Hematological: Negative.   Psychiatric/Behavioral: Negative.        Objective:   Physical Exam  Constitutional: She is oriented to person, place, and time. Vital signs are normal.  She appears well-developed and well-nourished. No distress.  HENT:  Head: Normocephalic and atraumatic.  Right Ear: Hearing, external ear and ear canal normal.  Left Ear: Hearing, tympanic membrane, external ear and ear canal normal.  Nose: Nose normal. No septal deviation or nasal septal hematoma.  Mouth/Throat: Uvula is midline, oropharynx is clear and moist and mucous membranes are normal. No oral lesions. Normal dentition. No dental caries.  R TM obscured by excess cerumen; pt declines irrigation, stating her hearing is normal.  Eyes: Conjunctivae, EOM and lids are normal. Pupils are equal, round, and reactive to light. No scleral icterus.  Fundoscopic exam:      The right eye shows no arteriolar narrowing, no AV nicking and no  papilledema. The right eye shows red reflex.       The left eye shows no arteriolar narrowing, no AV nicking and no papilledema. The left eye shows red reflex.  Neck: Trachea normal, normal range of motion, full passive range of motion without pain and phonation normal. Neck supple. No JVD present. No spinous process tenderness and no muscular tenderness present. Carotid bruit is not present. No thyroid mass and no thyromegaly present.  Cardiovascular: Normal rate, regular rhythm, S1 normal, S2 normal, intact distal pulses and normal pulses.   No extrasystoles are present. PMI is not displaced.  Exam reveals no gallop and no friction rub.   No murmur heard. Pulmonary/Chest: Effort normal and breath sounds normal. No respiratory distress. She has no decreased breath sounds. She has no wheezes. She has no rhonchi. Right breast exhibits no inverted nipple, no mass, no nipple discharge, no skin change and no tenderness. Left breast exhibits no inverted nipple, no mass, no nipple discharge, no skin change and no tenderness. Breasts are symmetrical.  Abdominal: Soft. Normal appearance and bowel sounds are normal. She exhibits no distension, no abdominal bruit, no pulsatile midline mass and no mass. There is no hepatosplenomegaly. There is no tenderness. There is no guarding and no CVA tenderness.  Genitourinary: Vagina normal and uterus normal. There is no rash, tenderness or lesion on the right labia. There is no rash, tenderness or lesion on the left labia. Cervix exhibits no motion tenderness, no discharge and no friability. Right adnexum displays no mass, no tenderness and no fullness. Left adnexum displays no mass, no tenderness and no fullness.  Cervical os stenotic; vaginal mucosa atrophic.  Musculoskeletal:       Cervical back: Normal.       Thoracic back: Normal.       Lumbar back: Normal.  Remainder of exam unremarkable.  Lymphadenopathy:       Head (right side): No submental, no submandibular, no  tonsillar, no preauricular, no posterior auricular and no occipital adenopathy present.       Head (left side): No submental, no submandibular, no tonsillar, no preauricular, no posterior auricular and no occipital adenopathy present.    She has no cervical adenopathy.    She has no axillary adenopathy.       Right: No inguinal and no supraclavicular adenopathy present.       Left: No inguinal and no supraclavicular adenopathy present.  Neurological: She is alert and oriented to person, place, and time. She has normal strength. She displays no atrophy. No cranial nerve deficit or sensory deficit. She exhibits normal muscle tone. Coordination and gait normal.  Reflex Scores:      Tricep reflexes are 1+ on the right side and 1+ on the left side.  Bicep reflexes are 1+ on the right side and 1+ on the left side.      Brachioradialis reflexes are 1+ on the right side and 1+ on the left side.      Patellar reflexes are 1+ on the right side and 1+ on the left side. Skin: Skin is warm, dry and intact. Lesion noted. No ecchymosis and no rash noted. She is not diaphoretic. No cyanosis or erythema. No pallor. Nails show no clubbing.  Multiple pigmented and waxy ovoid and round lesions of varying sizes on trunk and proximal extremities.  Psychiatric: She has a normal mood and affect. Her speech is normal and behavior is normal. Judgment and thought content normal. Cognition and memory are normal.  Nursing note and vitals reviewed.      Assessment & Plan:  Encounter for routine history and physical exam in female patient - Plan: COMPLETE METABOLIC PANEL WITH GFR, TSH, POCT UA - Microscopic Only, POCT urinalysis dipstick  Encounter for cervical Pap smear with pelvic exam - Plan: Pap IG (Image Guided)  Need for hepatitis C screening test - Plan: Hepatitis C antibody, Lipid panel  Screening for colon cancer - Plan: POC Hemoccult Bld/Stl (3-Cd Home Screen), Ambulatory referral to Gastroenterology

## 2014-07-09 NOTE — Patient Instructions (Addendum)
Keeping You Healthy  Get These Tests  Blood Pressure- Have your blood pressure checked by your healthcare provider at least once a year.  Normal blood pressure is 120/80.  Weight- Have your body mass index (BMI) calculated to screen for obesity.  BMI is a measure of body fat based on height and weight.  You can calculate your own BMI at [email or web address].com/bmi/  Cholesterol- Have your cholesterol checked every year.  Diabetes- Have your blood sugar checked every year if you have high blood pressure, high cholesterol, a family history of diabetes or if you are overweight.  Pap Smear- Have a pap smear every 1 to 3 years if you have been sexually active.  If you are older than 65 and recent pap smears have been normal you may not need additional pap smears.  In addition, if you have had a hysterectomy  For benign disease additional pap smears are not necessary.  Mammogram-Yearly mammograms are essential for early detection of breast cancer  Screening for Colon Cancer- Colonoscopy starting at age 50. Screening may begin sooner depending on your family history and other health conditions.  Follow up colonoscopy as directed by your Gastroenterologist.  Screening for Osteoporosis- Screening begins at age 65 with bone density scanning, sooner if you are at higher risk for developing Osteoporosis.  Get these medicines  Calcium with Vitamin D- Your body requires 1200-1500 mg of Calcium a day and 800-1000 IU of Vitamin D a day.  You can only absorb 500 mg of Calcium at a time therefore Calcium must be taken in 2 or 3 separate doses throughout the day.  Hormones- Hormone therapy has been associated with increased risk for certain cancers and heart disease.  Talk to your healthcare provider about if you need relief from menopausal symptoms.  Aspirin- Ask your healthcare provider about taking Aspirin to prevent Heart Disease and Stroke.  Get these Immuniztions  Flu shot- Every fall  Pneumonia  shot- Once after the age of 65; if you are younger ask your healthcare provider if you need a pneumonia shot.  Tetanus- Every ten years.  Zostavax- Once after the age of 60 to prevent shingles.  Take these steps  Don't smoke- Your healthcare provider can help you quit. For tips on how to quit, ask your healthcare provider or go to www.smokefree.gov or call 1-800 QUIT-NOW.  Be physically active- Exercise 5 days a week for a minimum of 30 minutes.  If you are not already physically active, start slow and gradually work up to 30 minutes of moderate physical activity.  Try walking, dancing, bike riding, swimming, etc.  Eat a healthy diet- Eat a variety of healthy foods such as fruits, vegetables, whole grains, low fat milk, low fat cheeses, yogurt, lean meats, chicken, fish, eggs, dried beans, tofu, etc.  For more information go to www.thenutritionsource.org  Dental visit- Brush and floss teeth twice daily; visit your dentist twice a year.  Eye exam- Visit your Optometrist or Ophthalmologist yearly.  Drink alcohol in moderation- Limit alcohol intake to one drink or less a day.  Never drink and drive.  Depression- Your emotional health is as important as your physical health.  If you're feeling down or losing interest in things you normally enjoy, please talk to your healthcare provider.  Seat Belts- can save your life; always wear one  Smoke/Carbon Monoxide detectors- These detectors need to be installed on the appropriate level of your home.  Replace batteries at least once a year.  Violence- If anyone   is threatening or hurting you, please tell your healthcare provider.  Living Will/ Health care power of attorney- Discuss with your healthcare provider and family.   I will place an order for GI consultation after August 29, 2014.  You can discuss the particulars of the actual procedure with the specialist.  I need for you to complete and return the home stool collection. You have said  you will contact dermatology about skin evaluation.  If you have signed up for MyChart, you will be able to see your lab results within the next week.

## 2014-07-10 LAB — PAP IG (IMAGE GUIDED)

## 2014-07-10 LAB — HEPATITIS C ANTIBODY: HCV Ab: NEGATIVE

## 2014-09-05 ENCOUNTER — Encounter: Payer: Self-pay | Admitting: Family Medicine

## 2014-10-13 ENCOUNTER — Other Ambulatory Visit: Payer: Self-pay | Admitting: Family Medicine

## 2014-10-14 NOTE — Telephone Encounter (Signed)
Dr Audria NineMcPherson, you have Rxd this for pt in past for HVS 1 and 2. Do you want to give RFs. Seen recently for check up but don't see this discussed.

## 2014-10-14 NOTE — Telephone Encounter (Signed)
Valacyclovir 1000 mg refill routed to pt's pharmacy.

## 2014-12-25 ENCOUNTER — Ambulatory Visit (INDEPENDENT_AMBULATORY_CARE_PROVIDER_SITE_OTHER): Payer: 59 | Admitting: Family Medicine

## 2014-12-25 ENCOUNTER — Encounter: Payer: Self-pay | Admitting: Family Medicine

## 2014-12-25 VITALS — BP 128/89 | HR 88 | Temp 98.7°F | Resp 16 | Ht 66.5 in | Wt 161.0 lb

## 2014-12-25 DIAGNOSIS — R1010 Upper abdominal pain, unspecified: Secondary | ICD-10-CM | POA: Diagnosis not present

## 2014-12-25 DIAGNOSIS — R1011 Right upper quadrant pain: Principal | ICD-10-CM

## 2014-12-25 DIAGNOSIS — R110 Nausea: Secondary | ICD-10-CM

## 2014-12-25 DIAGNOSIS — G8929 Other chronic pain: Secondary | ICD-10-CM

## 2014-12-25 LAB — CBC WITH DIFFERENTIAL/PLATELET
Basophils Absolute: 0 10*3/uL (ref 0.0–0.1)
Basophils Relative: 0 % (ref 0–1)
EOS ABS: 0.1 10*3/uL (ref 0.0–0.7)
Eosinophils Relative: 1 % (ref 0–5)
HCT: 43.9 % (ref 36.0–46.0)
Hemoglobin: 14.8 g/dL (ref 12.0–15.0)
Lymphocytes Relative: 23 % (ref 12–46)
Lymphs Abs: 1.3 10*3/uL (ref 0.7–4.0)
MCH: 31.6 pg (ref 26.0–34.0)
MCHC: 33.7 g/dL (ref 30.0–36.0)
MCV: 93.8 fL (ref 78.0–100.0)
MONO ABS: 0.5 10*3/uL (ref 0.1–1.0)
MPV: 10.4 fL (ref 8.6–12.4)
Monocytes Relative: 9 % (ref 3–12)
NEUTROS ABS: 3.8 10*3/uL (ref 1.7–7.7)
NEUTROS PCT: 67 % (ref 43–77)
PLATELETS: 258 10*3/uL (ref 150–400)
RBC: 4.68 MIL/uL (ref 3.87–5.11)
RDW: 14 % (ref 11.5–15.5)
WBC: 5.6 10*3/uL (ref 4.0–10.5)

## 2014-12-25 LAB — COMPREHENSIVE METABOLIC PANEL
ALT: 16 U/L (ref 0–35)
AST: 17 U/L (ref 0–37)
Albumin: 4.2 g/dL (ref 3.5–5.2)
Alkaline Phosphatase: 76 U/L (ref 39–117)
BILIRUBIN TOTAL: 0.4 mg/dL (ref 0.2–1.2)
BUN: 9 mg/dL (ref 6–23)
CO2: 27 mEq/L (ref 19–32)
CREATININE: 0.73 mg/dL (ref 0.50–1.10)
Calcium: 9.6 mg/dL (ref 8.4–10.5)
Chloride: 105 mEq/L (ref 96–112)
Glucose, Bld: 94 mg/dL (ref 70–99)
Potassium: 4.8 mEq/L (ref 3.5–5.3)
Sodium: 141 mEq/L (ref 135–145)
TOTAL PROTEIN: 6.9 g/dL (ref 6.0–8.3)

## 2014-12-25 LAB — LIPASE: Lipase: 12 U/L (ref 0–75)

## 2014-12-25 NOTE — Patient Instructions (Addendum)
Low-Fat Diet for Pancreatitis or Gallbladder Conditions A low-fat diet can be helpful if you have pancreatitis or a gallbladder condition. With these conditions, your pancreas and gallbladder have trouble digesting fats. A healthy eating plan with less fat will help rest your pancreas and gallbladder and reduce your symptoms. WHAT DO I NEED TO KNOW ABOUT THIS DIET?  Eat a low-fat diet.  Reduce your fat intake to less than 20-30% of your total daily calories. This is less than 50-60 g of fat per day.  Remember that you need some fat in your diet. Ask your dietician what your daily goal should be.  Choose nonfat and low-fat healthy foods. Look for the words "nonfat," "low fat," or "fat free."  As a guide, look on the label and choose foods with less than 3 g of fat per serving. Eat only one serving.  Avoid alcohol.  Do not smoke. If you need help quitting, talk with your health care provider.  Eat small frequent meals instead of three large heavy meals. WHAT FOODS CAN I EAT? Grains Include healthy grains and starches such as potatoes, wheat bread, fiber-rich cereal, and brown rice. Choose whole grain options whenever possible. In adults, whole grains should account for 45-65% of your daily calories.  Fruits and Vegetables Eat plenty of fruits and vegetables. Fresh fruits and vegetables add fiber to your diet. Meats and Other Protein Sources Eat lean meat such as chicken and pork. Trim any fat off of meat before cooking it. Eggs, fish, and beans are other sources of protein. In adults, these foods should account for 10-35% of your daily calories. Dairy Choose low-fat milk and dairy options. Dairy includes fat and protein, as well as calcium.  Fats and Oils Limit high-fat foods such as fried foods, sweets, baked goods, sugary drinks.  Other Creamy sauces and condiments, such as mayonnaise, can add extra fat. Think about whether or not you need to use them, or use smaller amounts or low fat  options. WHAT FOODS ARE NOT RECOMMENDED?  High fat foods, such as:  [employer]aked goods.  Ice cream.  [place] toast.  Sweet rolls.  Pizza.  Cheese bread.  Foods covered with batter, butter, creamy sauces, or cheese.  Fried foods.  Sugary drinks and desserts.  Foods that cause gas or bloating Document Released: 08/20/2013 Document Reviewed: 08/20/2013 [facility] Patient Information 2015 [place]. This information is not intended to replace advice given to you by your health care provider. Make sure you discuss any questions you have with your health care provider.   I will contact you with the results of labs through MyChart and ultrasound results as soon as I have reviewed the report. If there is a gallbladder problem, you will be referred to a surgeon. If you have a preference, you can send me the name through MyChart (though [place]

## 2014-12-28 ENCOUNTER — Encounter: Payer: Self-pay | Admitting: Family Medicine

## 2014-12-28 NOTE — Progress Notes (Signed)
S:  This 64 y.o female presents for eval of RUQ pain, intermittent and onset > 1 year ago. Pt notes increased symptoms w/ fried and fatty foods. She has nausea w/o vomiting and reports low grade fever on one occasion. Pain sometimes radiates around to R flank and up into shoulder area. Denies change in stool color or frequency. She has researched symptoms and thinks it may be related to gallbladder dysfunction. No family hx of GB disease.  Patient Active Problem List   Diagnosis Date Noted  . Back pain 09/29/2013  . Mild intermittent reactive airway disease without complication 04/25/2013  . Right foot pain 06/13/2011  . Fracture of metatarsal bone of right foot 06/13/2011    Prior to Admission medications   Medication Sig Start Date End Date Taking? Authorizing Provider  albuterol (PROVENTIL HFA;VENTOLIN HFA) 108 (90 BASE) MCG/ACT inhaler Inhale 2 puffs into the lungs every 6 (six) hours as needed for wheezing. 04/25/13  Yes Maurice MarchBarbara B Mithra Spano, MD  ALPRAZolam Prudy Feeler(XANAX) 0.25 MG tablet Take 1/2 -1 tablet by mouth prn anxiety. 05/07/14  Yes Maurice MarchBarbara B Madalene Mickler, MD  Cholecalciferol (VITAMIN D PO) Take 2 tablets by mouth daily.   Yes Historical Provider, MD  valACYclovir (VALTREX) 1000 MG tablet TAKE 1 TABLET TWICE A DAY OR AS DIRECTED. 10/14/14  Yes Maurice MarchBarbara B Tayton Decaire, MD    Past Surgical History  Procedure Laterality Date  . Nephroplasty      SOC and FAM HX reviewed.  ROS: AS per HPI.  O: Filed Vitals:   12/25/14 1536  BP: 128/89  Pulse: 88  Temp: 98.7 F (37.1 C)  Resp: 16    GEN: in NAD; WN,WD. HENT: /AT; EOMI w/ clear conj/sclerae. Ears/nose/throat normal. NECK: Supple w/o LAN. COR: RRR. LUNGS: Normal resp rate and effort. ABD: No CVAT. Decreased BS. Not distended. Murphy's sign- equivocal. No masses or HSM. SKIN: W&D; no jaundice or pallor. NEURO: A&O x 3; CNs intact. Nonfocal.  A/P: Abdominal pain, chronic, right upper quadrant - R/O gallbladder disease. Plan: CBC  with Differential/Platelet, Comprehensive metabolic panel, Lipase, US Abdomen Limited RUQ  Nausea without vomiting - Plan: US Abdomen Limited RUQ Pt provided w/ print info re: nutrition for gallbladder and pancreatic conditions.

## 2015-01-09 ENCOUNTER — Other Ambulatory Visit: Payer: Self-pay | Admitting: Family Medicine

## 2015-01-09 NOTE — Telephone Encounter (Signed)
Alprazolam refill phoned to pt's pharmacy. 

## 2015-03-19 ENCOUNTER — Ambulatory Visit
Admission: RE | Admit: 2015-03-19 | Discharge: 2015-03-19 | Disposition: A | Discharge status: Home / Self Care | Payer: 59 | Source: Ambulatory Visit | Attending: Family Medicine | Admitting: Family Medicine

## 2015-03-19 DIAGNOSIS — R1011 Right upper quadrant pain: Principal | ICD-10-CM

## 2015-03-19 DIAGNOSIS — G8929 Other chronic pain: Secondary | ICD-10-CM

## 2015-03-19 DIAGNOSIS — R110 Nausea: Secondary | ICD-10-CM

## 2015-03-23 ENCOUNTER — Encounter: Payer: Self-pay | Admitting: Family Medicine

## 2015-05-13 ENCOUNTER — Other Ambulatory Visit: Payer: Self-pay

## 2015-05-13 DIAGNOSIS — F419 Anxiety disorder, unspecified: Secondary | ICD-10-CM

## 2015-05-13 MED ORDER — ALPRAZOLAM 0.25 MG PO TABS: ORAL_TABLET | ORAL | Status: AC

## 2015-05-14 NOTE — Telephone Encounter (Signed)
Faxed

## 2015-05-23 IMAGING — CR DG CHEST 1V PORT
1 series · 1 of 1 positions shown · non-contrast
Comparison: None.

CLINICAL DATA: Chest pain.  Dizziness.  Short of breath.

PORTABLE CHEST - 1 VIEW

[AP]
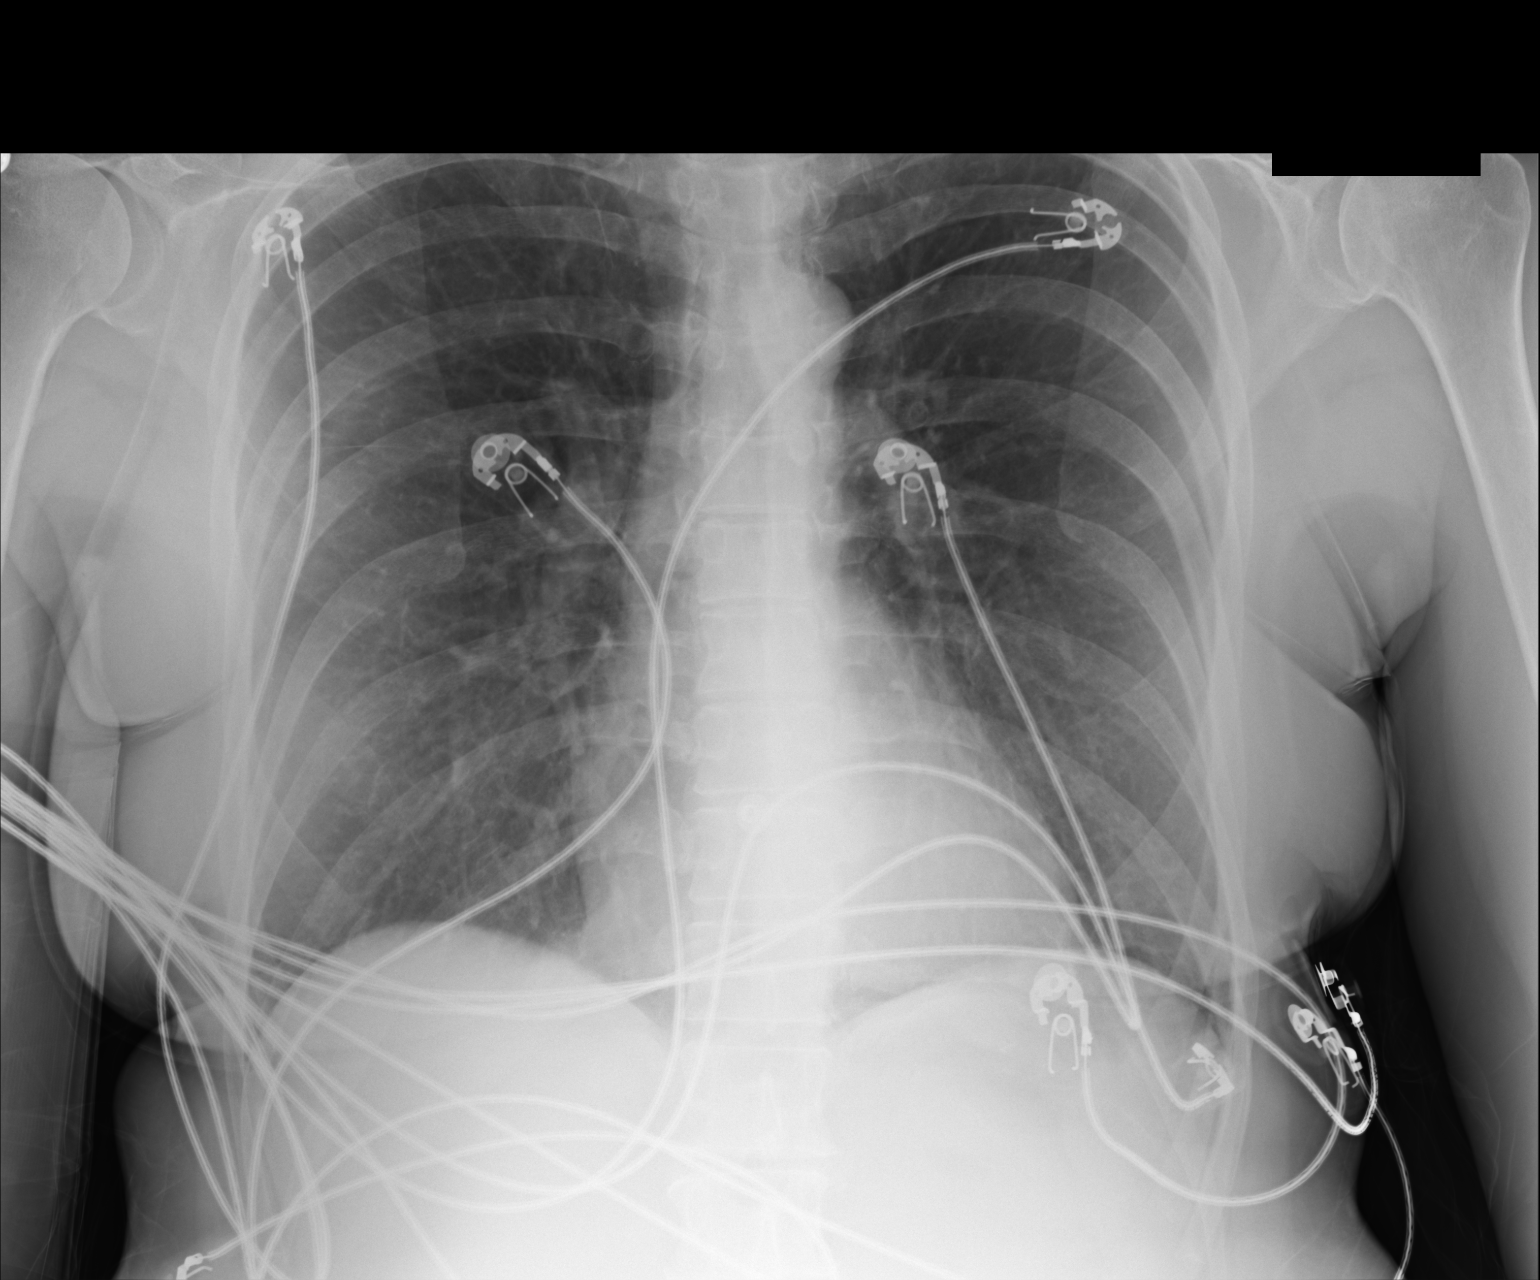

[1 of 1 positions shown; findings below may reference images not displayed]

FINDINGS: Cardiopericardial silhouette within normal limits.
Mediastinal contours normal. Trachea midline.  No airspace disease
or effusion. Monitoring leads are projected over the chest. No
pneumothorax.
IMPRESSION: No active cardiopulmonary disease.

## 2015-08-03 ENCOUNTER — Other Ambulatory Visit: Payer: Self-pay | Admitting: Physician Assistant

## 2015-08-05 NOTE — Telephone Encounter (Signed)
Spoke to pt about need for f/up. She did not see a note on the last RF in Sept. Pt stated that [place] She wanted to get this RF to have on hand until [place]. States she doesn't normally need it too often, but things are more stressful w/move and having 64 yo father in law move in with them. Can we get her a RF to hold her over for new provider?

## 2015-08-05 NOTE — Telephone Encounter (Signed)
Notified pt of Nicole's message.

## 2015-08-05 NOTE — Telephone Encounter (Signed)
Unfortunately, I cannot refill this med. She has not been seen in office for her anxiety in over a year. If she needs a refill, she should make an appt or go to walk-in clinic to be seen.

## 2021-07-07 ENCOUNTER — Ambulatory Visit (HOSPITAL_BASED_OUTPATIENT_CLINIC_OR_DEPARTMENT_OTHER): Payer: Self-pay | Admitting: Physical Therapy
